# Patient Record
Sex: Male | Born: 1965 | Race: White | Hispanic: No | Marital: Single | Smoking: Never smoker
Health system: Southern US, Community
[De-identification: ages and names within clinical notes are randomized; demographics above are authoritative.]

## PROBLEM LIST (undated history)

## (undated) DIAGNOSIS — I10 Essential (primary) hypertension: Secondary | ICD-10-CM

## (undated) DIAGNOSIS — E119 Type 2 diabetes mellitus without complications: Secondary | ICD-10-CM

## (undated) DIAGNOSIS — F419 Anxiety disorder, unspecified: Secondary | ICD-10-CM

## (undated) HISTORY — PX: TONSILLECTOMY: SUR1361

---

## 1999-09-07 ENCOUNTER — Emergency Department (HOSPITAL_COMMUNITY): Admission: EM | Admit: 1999-09-07 | Discharge: 1999-09-07 | Payer: Self-pay | Admitting: Emergency Medicine

## 2003-12-09 ENCOUNTER — Emergency Department (HOSPITAL_COMMUNITY): Admission: EM | Admit: 2003-12-09 | Discharge: 2003-12-09 | Payer: Self-pay | Admitting: Emergency Medicine

## 2004-01-20 ENCOUNTER — Emergency Department (HOSPITAL_COMMUNITY): Admission: EM | Admit: 2004-01-20 | Discharge: 2004-01-20 | Payer: Self-pay | Admitting: Emergency Medicine

## 2004-03-19 ENCOUNTER — Ambulatory Visit (HOSPITAL_COMMUNITY): Admission: RE | Admit: 2004-03-19 | Discharge: 2004-03-19 | Payer: Self-pay | Admitting: Internal Medicine

## 2004-05-15 ENCOUNTER — Ambulatory Visit: Payer: Self-pay | Admitting: Internal Medicine

## 2004-10-04 ENCOUNTER — Ambulatory Visit: Payer: Self-pay | Admitting: Family Medicine

## 2004-10-08 ENCOUNTER — Ambulatory Visit: Payer: Self-pay | Admitting: *Deleted

## 2004-10-22 ENCOUNTER — Ambulatory Visit: Payer: Self-pay | Admitting: Internal Medicine

## 2005-02-16 ENCOUNTER — Inpatient Hospital Stay (HOSPITAL_COMMUNITY): Admission: EM | Admit: 2005-02-16 | Discharge: 2005-02-18 | Payer: Self-pay

## 2005-03-26 ENCOUNTER — Ambulatory Visit: Payer: Self-pay | Admitting: Internal Medicine

## 2005-04-16 ENCOUNTER — Ambulatory Visit: Payer: Self-pay | Admitting: Internal Medicine

## 2005-05-14 ENCOUNTER — Ambulatory Visit: Payer: Self-pay | Admitting: Internal Medicine

## 2005-12-23 ENCOUNTER — Ambulatory Visit: Payer: Self-pay | Admitting: Internal Medicine

## 2005-12-30 ENCOUNTER — Ambulatory Visit: Payer: Self-pay | Admitting: Family Medicine

## 2006-03-03 ENCOUNTER — Ambulatory Visit: Payer: Self-pay | Admitting: Internal Medicine

## 2006-03-31 ENCOUNTER — Ambulatory Visit: Payer: Self-pay | Admitting: Family Medicine

## 2006-05-08 ENCOUNTER — Emergency Department (HOSPITAL_COMMUNITY): Admission: EM | Admit: 2006-05-08 | Discharge: 2006-05-08 | Payer: Self-pay | Admitting: Emergency Medicine

## 2006-05-13 ENCOUNTER — Emergency Department (HOSPITAL_COMMUNITY): Admission: EM | Admit: 2006-05-13 | Discharge: 2006-05-13 | Payer: Self-pay | Admitting: Emergency Medicine

## 2006-07-09 ENCOUNTER — Emergency Department (HOSPITAL_COMMUNITY): Admission: EM | Admit: 2006-07-09 | Discharge: 2006-07-09 | Payer: Self-pay | Admitting: Emergency Medicine

## 2006-07-22 ENCOUNTER — Ambulatory Visit: Payer: Self-pay | Admitting: Internal Medicine

## 2006-10-09 ENCOUNTER — Ambulatory Visit: Payer: Self-pay | Admitting: Internal Medicine

## 2007-03-13 ENCOUNTER — Ambulatory Visit: Payer: Self-pay | Admitting: Internal Medicine

## 2007-05-04 ENCOUNTER — Ambulatory Visit: Payer: Self-pay | Admitting: Internal Medicine

## 2007-05-04 LAB — CONVERTED CEMR LAB
Albumin: 4.9 g/dL (ref 3.5–5.2)
Alkaline Phosphatase: 90 units/L (ref 39–117)
BUN: 16 mg/dL (ref 6–23)
CO2: 21 meq/L (ref 19–32)
Calcium: 10 mg/dL (ref 8.4–10.5)
Chloride: 96 meq/L (ref 96–112)
Glucose, Bld: 249 mg/dL — ABNORMAL HIGH (ref 70–99)
HDL: 48 mg/dL (ref >39)
Potassium: 4 meq/L (ref 3.5–5.3)
Total Bilirubin: 1.2 mg/dL (ref 0.3–1.2)
Total CHOL/HDL Ratio: 5.5

## 2007-05-13 ENCOUNTER — Encounter (INDEPENDENT_AMBULATORY_CARE_PROVIDER_SITE_OTHER): Payer: Self-pay | Admitting: *Deleted

## 2007-06-15 ENCOUNTER — Ambulatory Visit: Payer: Self-pay | Admitting: Internal Medicine

## 2007-10-28 ENCOUNTER — Ambulatory Visit: Payer: Self-pay | Admitting: Internal Medicine

## 2008-01-06 ENCOUNTER — Ambulatory Visit: Payer: Self-pay | Admitting: Internal Medicine

## 2008-03-22 ENCOUNTER — Emergency Department (HOSPITAL_COMMUNITY): Admission: EM | Admit: 2008-03-22 | Discharge: 2008-03-22 | Payer: Self-pay | Admitting: Emergency Medicine

## 2008-04-21 ENCOUNTER — Ambulatory Visit: Payer: Self-pay | Admitting: Internal Medicine

## 2008-09-08 ENCOUNTER — Ambulatory Visit: Payer: Self-pay | Admitting: Internal Medicine

## 2008-12-29 ENCOUNTER — Ambulatory Visit: Payer: Self-pay | Admitting: Internal Medicine

## 2009-01-19 ENCOUNTER — Ambulatory Visit: Payer: Self-pay | Admitting: Internal Medicine

## 2009-02-22 ENCOUNTER — Ambulatory Visit: Payer: Self-pay | Admitting: Family Medicine

## 2009-02-22 ENCOUNTER — Ambulatory Visit: Payer: Self-pay | Admitting: Internal Medicine

## 2009-06-08 ENCOUNTER — Ambulatory Visit: Payer: Self-pay | Admitting: Internal Medicine

## 2009-09-13 ENCOUNTER — Ambulatory Visit: Payer: Self-pay | Admitting: Internal Medicine

## 2009-09-13 LAB — CONVERTED CEMR LAB
Direct LDL: 120 mg/dL — ABNORMAL HIGH
HDL: 42 mg/dL (ref >39)
LDL Cholesterol: 118 mg/dL — ABNORMAL HIGH (ref 0–99)
Triglycerides: 301 mg/dL — ABNORMAL HIGH (ref <150)
VLDL: 60 mg/dL — ABNORMAL HIGH (ref 0–40)

## 2009-12-11 ENCOUNTER — Emergency Department (HOSPITAL_COMMUNITY): Admission: EM | Admit: 2009-12-11 | Discharge: 2009-12-11 | Payer: Self-pay | Admitting: Emergency Medicine

## 2010-01-04 ENCOUNTER — Ambulatory Visit: Payer: Self-pay | Admitting: Internal Medicine

## 2010-09-16 ENCOUNTER — Encounter: Payer: Self-pay | Admitting: Internal Medicine

## 2010-11-07 ENCOUNTER — Encounter (INDEPENDENT_AMBULATORY_CARE_PROVIDER_SITE_OTHER): Payer: Self-pay | Admitting: Family Medicine

## 2011-06-16 ENCOUNTER — Emergency Department (HOSPITAL_COMMUNITY): Payer: Self-pay

## 2011-06-16 ENCOUNTER — Emergency Department (HOSPITAL_COMMUNITY)
Admission: EM | Admit: 2011-06-16 | Discharge: 2011-06-16 | Disposition: A | Discharge status: Home / Self Care | Payer: Self-pay | Attending: Emergency Medicine | Admitting: Emergency Medicine

## 2011-06-16 DIAGNOSIS — R220 Localized swelling, mass and lump, head: Secondary | ICD-10-CM | POA: Insufficient documentation

## 2011-06-16 DIAGNOSIS — F1010 Alcohol abuse, uncomplicated: Secondary | ICD-10-CM | POA: Insufficient documentation

## 2011-06-16 DIAGNOSIS — S0003XA Contusion of scalp, initial encounter: Secondary | ICD-10-CM | POA: Insufficient documentation

## 2011-06-16 DIAGNOSIS — E119 Type 2 diabetes mellitus without complications: Secondary | ICD-10-CM | POA: Insufficient documentation

## 2011-06-16 DIAGNOSIS — R079 Chest pain, unspecified: Secondary | ICD-10-CM | POA: Insufficient documentation

## 2011-06-16 DIAGNOSIS — Z79899 Other long term (current) drug therapy: Secondary | ICD-10-CM | POA: Insufficient documentation

## 2011-06-16 DIAGNOSIS — W010XXA Fall on same level from slipping, tripping and stumbling without subsequent striking against object, initial encounter: Secondary | ICD-10-CM | POA: Insufficient documentation

## 2011-06-16 DIAGNOSIS — R4789 Other speech disturbances: Secondary | ICD-10-CM | POA: Insufficient documentation

## 2011-06-16 LAB — BASIC METABOLIC PANEL
BUN: 12 mg/dL (ref 6–23)
CO2: 22 mEq/L (ref 19–32)
Chloride: 93 mEq/L — ABNORMAL LOW (ref 96–112)
GFR calc Af Amer: >90 mL/min (ref >90)
Potassium: 3.7 mEq/L (ref 3.5–5.1)

## 2011-06-16 LAB — URINALYSIS, ROUTINE W REFLEX MICROSCOPIC
Hgb urine dipstick: NEGATIVE
Leukocytes, UA: NEGATIVE
Protein, ur: NEGATIVE mg/dL
Specific Gravity, Urine: 1.004 — ABNORMAL LOW (ref 1.005–1.030)
Urobilinogen, UA: 0.2 mg/dL (ref 0.0–1.0)

## 2011-06-16 LAB — DIFFERENTIAL
Basophils Absolute: 0.1 10*3/uL (ref 0.0–0.1)
Eosinophils Absolute: 0.2 10*3/uL (ref 0.0–0.7)
Lymphs Abs: 3.6 10*3/uL (ref 0.7–4.0)
Neutro Abs: 6.1 10*3/uL (ref 1.7–7.7)

## 2011-06-16 LAB — RAPID URINE DRUG SCREEN, HOSP PERFORMED
Amphetamines: NOT DETECTED
Barbiturates: NOT DETECTED
Opiates: NOT DETECTED

## 2011-06-16 LAB — CBC
HCT: 41.3 % (ref 39.0–52.0)
MCHC: 36.6 g/dL — ABNORMAL HIGH (ref 30.0–36.0)
MCV: 89 fL (ref 78.0–100.0)
RBC: 4.64 MIL/uL (ref 4.22–5.81)

## 2012-12-28 ENCOUNTER — Emergency Department (HOSPITAL_COMMUNITY)
Admission: EM | Admit: 2012-12-28 | Discharge: 2012-12-28 | Disposition: A | Discharge status: Home / Self Care | Payer: Self-pay | Source: Home / Self Care | Attending: Emergency Medicine | Admitting: Emergency Medicine

## 2012-12-28 ENCOUNTER — Encounter (HOSPITAL_COMMUNITY): Payer: Self-pay | Admitting: Emergency Medicine

## 2012-12-28 DIAGNOSIS — F419 Anxiety disorder, unspecified: Secondary | ICD-10-CM

## 2012-12-28 DIAGNOSIS — F411 Generalized anxiety disorder: Secondary | ICD-10-CM

## 2012-12-28 DIAGNOSIS — I10 Essential (primary) hypertension: Secondary | ICD-10-CM

## 2012-12-28 DIAGNOSIS — E119 Type 2 diabetes mellitus without complications: Secondary | ICD-10-CM

## 2012-12-28 HISTORY — DX: Type 2 diabetes mellitus without complications: E11.9

## 2012-12-28 HISTORY — DX: Essential (primary) hypertension: I10

## 2012-12-28 HISTORY — DX: Anxiety disorder, unspecified: F41.9

## 2012-12-28 LAB — POCT I-STAT, CHEM 8
Calcium, Ion: 1.14 mmol/L (ref 1.12–1.23)
Chloride: 99 mEq/L (ref 96–112)
Glucose, Bld: 574 mg/dL (ref 70–99)
Sodium: 133 mEq/L — ABNORMAL LOW (ref 135–145)
TCO2: 25 mmol/L (ref 0–100)

## 2012-12-28 LAB — HEMOGLOBIN A1C: Mean Plasma Glucose: 283 mg/dL — ABNORMAL HIGH (ref <117)

## 2012-12-28 MED ORDER — ALPRAZOLAM 1 MG PO TABS: 1.0000 mg | ORAL_TABLET | Freq: Three times a day (TID) | ORAL | Status: AC | PRN

## 2012-12-28 MED ORDER — CLONIDINE HCL 0.1 MG PO TABS
ORAL_TABLET | ORAL | Status: AC
  Filled 2012-12-28: qty 1

## 2012-12-28 MED ORDER — ASPIRIN 81 MG PO CHEW: 81.0000 mg | CHEWABLE_TABLET | ORAL | Status: DC

## 2012-12-28 MED ORDER — INSULIN ASPART 100 UNIT/ML ~~LOC~~ SOLN
SUBCUTANEOUS | Status: AC
  Filled 2012-12-28: qty 1

## 2012-12-28 MED ORDER — INSULIN GLARGINE 100 UNIT/ML ~~LOC~~ SOLN: 20.0000 [IU] | Freq: Every day | SUBCUTANEOUS | Status: DC

## 2012-12-28 MED ORDER — CLONIDINE HCL 0.1 MG PO TABS
0.1000 mg | ORAL_TABLET | Freq: Once | ORAL | Status: AC
  Administered 2012-12-28: 0.1 mg via ORAL

## 2012-12-28 MED ORDER — LISINOPRIL 10 MG PO TABS: 10.0000 mg | ORAL_TABLET | Freq: Every day | ORAL | Status: DC

## 2012-12-28 MED ORDER — INSULIN ASPART 100 UNIT/ML ~~LOC~~ SOLN
10.0000 [IU] | Freq: Once | SUBCUTANEOUS | Status: AC
  Administered 2012-12-28: 10 [IU] via SUBCUTANEOUS

## 2012-12-28 NOTE — ED Notes (Signed)
Pt is here to have meds refilled Taking Lantus 20 units, and xanax... Also was taking HP med but he doesn't remember the name Denies any medical prob  He is alert and oriented w/no signs of acute distress.

## 2012-12-28 NOTE — ED Provider Notes (Signed)
History     CSN: [id number]  Arrival date & time 12/28/12  1048   First MD Initiated Contact with Patient 12/28/12 1113      Chief Complaint  Patient presents with  . Medication Refill    (Consider location/radiation/quality/duration/timing/severity/associated sxs/prior treatment) HPI Comments: Patient presents to urgent care requesting blood pressure medicines. Has history of diabetes and hypertension and has been off his medicines as for about 6 months since [facility] closed, he has been monitoring his sugars at home and usually sees his glucose readings in the low 200s ( fasting). He has not monitored his blood pressure at home. This point he is uncertain about what blood pressure medicine he was taken previously. As well as he has not used his Lantus for approximately 6 months.  Currently patient describes it perhaps the only symptoms that he experiences recurrently his anxiety related . Denies any chest pains, palpitations shortness of breath headaches or paresthesias.    The history is provided by the patient.    Past Medical History  Diagnosis Date  . Anxiety   . Hypertension   . Diabetes mellitus without complication     Past Surgical History  Procedure Laterality Date  . Tonsillectomy      History reviewed. No pertinent family history.  History  Substance Use Topics  . Smoking status: Never Smoker   . Smokeless tobacco: Not on file  . Alcohol Use: No      Review of Systems  Constitutional: Positive for fatigue. Negative for fever, diaphoresis, activity change and unexpected weight change.  HENT: Negative for neck pain.   Respiratory: Negative for cough and shortness of breath.   Cardiovascular: Negative for chest pain, palpitations and leg swelling.  Gastrointestinal: Negative for abdominal pain.  Endocrine: Positive for polyuria. Negative for heat intolerance, polydipsia and polyphagia.  Musculoskeletal: Negative for myalgias, joint swelling and  arthralgias.  Neurological: Negative for dizziness, facial asymmetry, light-headedness and headaches.  Psychiatric/Behavioral: Negative for suicidal ideas.    Allergies  Review of patient's allergies indicates no known allergies.  Home Medications   Current Outpatient Rx  Name  Route  Sig  Dispense  Refill  . ALPRAZolam (XANAX) 1 MG tablet   Oral   Take 1 tablet (1 mg total) by mouth 3 (three) times daily as needed for sleep or anxiety.   30 tablet   0   . aspirin 81 MG chewable tablet   Oral   Chew 1 tablet (81 mg total) by mouth 3 (three) times a week.   30 tablet   0   . insulin glargine (LANTUS) 100 UNIT/ML injection   Subcutaneous   Inject 0.2 mLs (20 Units total) into the skin at bedtime.   10 mL   2   . lisinopril (PRINIVIL,ZESTRIL) 10 MG tablet   Oral   Take 1 tablet (10 mg total) by mouth daily.   30 tablet   1     BP 174/114  Pulse 90  Temp(Src) 99.4 F (37.4 C) (Oral)  Resp 18  SpO2 98%  Physical Exam  Nursing note and vitals reviewed. Constitutional: He is oriented to person, place, and time. Vital signs are normal. He appears well-developed and well-nourished.  Non-toxic appearance. He does not have a sickly appearance. He does not appear ill. No distress.  HENT:  Head: Normocephalic.  Mouth/Throat: No oropharyngeal exudate.  Eyes: Conjunctivae are normal. Right eye exhibits no discharge. Left eye exhibits no discharge. No scleral icterus.  Neck: Neck supple. No  JVD present.  Cardiovascular: Normal rate, regular rhythm and normal heart sounds.  Exam reveals no gallop and no friction rub.   No murmur heard. Pulmonary/Chest: Effort normal and breath sounds normal.  Neurological: He is alert and oriented to person, place, and time. He has normal strength. No cranial nerve deficit or sensory deficit.  Skin: No rash noted. No erythema.    ED Course  Procedures (including critical care time)  Labs Reviewed  POCT I-STAT, CHEM 8 - Abnormal; Notable  for the following:    Sodium 133 (*)    Glucose, Bld 574 (*)    All other components within normal limits  HEMOGLOBIN A1C   No results found.   1. Hypertension   2. Diabetes   3. Anxiety disorder      Pseudohyponatremia- hyperglycemia- normal kidney function. MDM  Problem #1 lack of medical care- patient will establish with new primary care Dr. at the adult [facility] Former patient of [facility]- aware of the adult care center services  Problem #2 diabetes uncontrolled hemoglobin A1c ordered to resume his previous dose of Lantus 20 units at night and to monitor his fasting sugars and keep score records for new PCP   Problem #3 hypertension uncontrolled- no medication adherence patient will be resume on his ACE inhibitor, was also asked to keep records of his blood pressure readings at home   Problem #4 generalized anxiety disorder patient was given a prescription of Xanax for 30 tablets.     Jimmie Molly, MD 12/28/12 [phone]

## 2016-03-03 ENCOUNTER — Emergency Department (HOSPITAL_COMMUNITY): Payer: Self-pay

## 2016-03-03 ENCOUNTER — Emergency Department (HOSPITAL_COMMUNITY)
Admission: EM | Admit: 2016-03-03 | Discharge: 2016-03-03 | Disposition: A | Discharge status: Home / Self Care | Payer: Self-pay | Attending: Emergency Medicine | Admitting: Emergency Medicine

## 2016-03-03 ENCOUNTER — Encounter (HOSPITAL_COMMUNITY): Payer: Self-pay | Admitting: *Deleted

## 2016-03-03 DIAGNOSIS — J939 Pneumothorax, unspecified: Secondary | ICD-10-CM

## 2016-03-03 DIAGNOSIS — Y9389 Activity, other specified: Secondary | ICD-10-CM | POA: Insufficient documentation

## 2016-03-03 DIAGNOSIS — S2232XA Fracture of one rib, left side, initial encounter for closed fracture: Secondary | ICD-10-CM

## 2016-03-03 DIAGNOSIS — E119 Type 2 diabetes mellitus without complications: Secondary | ICD-10-CM | POA: Insufficient documentation

## 2016-03-03 DIAGNOSIS — Z7982 Long term (current) use of aspirin: Secondary | ICD-10-CM | POA: Insufficient documentation

## 2016-03-03 DIAGNOSIS — S270XXA Traumatic pneumothorax, initial encounter: Secondary | ICD-10-CM | POA: Insufficient documentation

## 2016-03-03 DIAGNOSIS — I10 Essential (primary) hypertension: Secondary | ICD-10-CM | POA: Insufficient documentation

## 2016-03-03 DIAGNOSIS — Y929 Unspecified place or not applicable: Secondary | ICD-10-CM | POA: Insufficient documentation

## 2016-03-03 DIAGNOSIS — Z79899 Other long term (current) drug therapy: Secondary | ICD-10-CM | POA: Insufficient documentation

## 2016-03-03 DIAGNOSIS — Y999 Unspecified external cause status: Secondary | ICD-10-CM | POA: Insufficient documentation

## 2016-03-03 DIAGNOSIS — Z794 Long term (current) use of insulin: Secondary | ICD-10-CM | POA: Insufficient documentation

## 2016-03-03 MED ORDER — HYDROCODONE-ACETAMINOPHEN 5-325 MG PO TABS
1.0000 | ORAL_TABLET | Freq: Once | ORAL | Status: AC
  Administered 2016-03-03: 1 via ORAL
  Filled 2016-03-03: qty 1

## 2016-03-03 MED ORDER — HYDROCODONE-ACETAMINOPHEN 5-325 MG PO TABS: 1.0000 | ORAL_TABLET | Freq: Four times a day (QID) | ORAL | Status: AC | PRN

## 2016-03-03 MED ORDER — HYDROMORPHONE HCL 1 MG/ML IJ SOLN
1.0000 mg | Freq: Once | INTRAMUSCULAR | Status: AC
  Administered 2016-03-03: 1 mg via INTRAMUSCULAR
  Filled 2016-03-03: qty 1

## 2016-03-03 NOTE — Discharge Instructions (Signed)
As discussed, it is important that you have a second x-ray performed tomorrow morning to ensure that pneumothorax is improving.  If you develop new, or concerning changes in your condition, please be sure to return here immediately.  Otherwise, please take medication as directed, and use the provided incentive spirometer 4 times daily for the next 5 days.
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
## 2016-03-11 ENCOUNTER — Encounter (HOSPITAL_COMMUNITY): Payer: Self-pay | Admitting: Emergency Medicine

## 2016-03-11 ENCOUNTER — Emergency Department (HOSPITAL_COMMUNITY)
Admission: EM | Admit: 2016-03-11 | Discharge: 2016-03-11 | Disposition: A | Discharge status: Home / Self Care | Payer: No Typology Code available for payment source | Attending: Emergency Medicine | Admitting: Emergency Medicine

## 2016-03-11 ENCOUNTER — Emergency Department (HOSPITAL_COMMUNITY): Payer: No Typology Code available for payment source

## 2016-03-11 DIAGNOSIS — S2232XA Fracture of one rib, left side, initial encounter for closed fracture: Secondary | ICD-10-CM

## 2016-03-11 DIAGNOSIS — Y939 Activity, unspecified: Secondary | ICD-10-CM | POA: Insufficient documentation

## 2016-03-11 DIAGNOSIS — Y999 Unspecified external cause status: Secondary | ICD-10-CM | POA: Insufficient documentation

## 2016-03-11 DIAGNOSIS — Z7984 Long term (current) use of oral hypoglycemic drugs: Secondary | ICD-10-CM | POA: Insufficient documentation

## 2016-03-11 DIAGNOSIS — I10 Essential (primary) hypertension: Secondary | ICD-10-CM | POA: Insufficient documentation

## 2016-03-11 DIAGNOSIS — W19XXXA Unspecified fall, initial encounter: Secondary | ICD-10-CM | POA: Insufficient documentation

## 2016-03-11 DIAGNOSIS — E119 Type 2 diabetes mellitus without complications: Secondary | ICD-10-CM | POA: Insufficient documentation

## 2016-03-11 DIAGNOSIS — R0602 Shortness of breath: Secondary | ICD-10-CM | POA: Insufficient documentation

## 2016-03-11 DIAGNOSIS — Z79899 Other long term (current) drug therapy: Secondary | ICD-10-CM | POA: Insufficient documentation

## 2016-03-11 DIAGNOSIS — Y929 Unspecified place or not applicable: Secondary | ICD-10-CM | POA: Insufficient documentation

## 2016-03-11 MED ORDER — HYDROMORPHONE HCL 1 MG/ML IJ SOLN
1.0000 mg | Freq: Once | INTRAMUSCULAR | Status: AC
  Administered 2016-03-11: 1 mg via INTRAMUSCULAR
  Filled 2016-03-11: qty 1

## 2016-03-11 MED ORDER — OXYCODONE-ACETAMINOPHEN 7.5-325 MG PO TABS: 1.0000 | ORAL_TABLET | ORAL | Status: DC | PRN

## 2016-03-11 NOTE — Discharge Instructions (Signed)

## 2016-03-11 NOTE — ED Provider Notes (Addendum)
CSN: [id number]     Arrival date & time 03/11/16  1250 History   First MD Initiated Contact with Patient 03/11/16 1318     Chief Complaint  Patient presents with  . Shortness of Breath  . Back Pain     Patient is a 50 y.o. male presenting with shortness of breath and back pain.  Shortness of Breath Associated symptoms: chest pain   Associated symptoms: no abdominal pain, no fever and no sore throat   Back Pain Associated symptoms: chest pain   Associated symptoms: no abdominal pain and no fever   Patient presents with worsening left-sided back pain. Was seen in the ER 8 days ago after fall and diagnosed with a left fifth rib fracture. Had a small pneumothorax the time. . Follow-up the next day with the surgeons, but was unable to get transportation there. States he was doing well over last few days but today had a cough and had severe pain in the back. Pain is worse with movement. No worse difficulty breathing. States the pain medicine helps but he is running out and thinks he may need something stronger.  Past Medical History  Diagnosis Date  . Anxiety   . Hypertension   . Diabetes mellitus without complication [facility])    Past Surgical History  Procedure Laterality Date  . Tonsillectomy     No family history on file. Social History  Substance Use Topics  . Smoking status: Never Smoker   . Smokeless tobacco: None  . Alcohol Use: No    Review of Systems  Constitutional: Negative for appetite change and fever.  HENT: Negative for sore throat and voice change.   Respiratory: Positive for shortness of breath.   Cardiovascular: Positive for chest pain.  Gastrointestinal: Negative for abdominal pain.  Genitourinary: Negative for difficulty urinating.  Musculoskeletal: Positive for back pain.  Psychiatric/Behavioral: Negative for behavioral problems.      Allergies  Review of patient's allergies indicates no known allergies.  Home Medications   Prior to Admission  medications   Medication Sig Start Date End Date Taking? Authorizing Provider  ALPRAZolam Prudy Feeler) 1 MG tablet Take 1 tablet (1 mg total) by mouth 3 (three) times daily as needed for sleep or anxiety. 12/28/12  Yes Jimmie Molly, MD  glyBURIDE (DIABETA) 5 MG tablet Take 5 mg by mouth 2 (two) times daily. 02/10/16  Yes Historical Provider, MD  metFORMIN (GLUCOPHAGE) 500 MG tablet TAKE 1 TABLET BY MOUTH 2 TIMES PER DAY WITH MORNING AND EVENING MEALS 02/10/16  Yes Historical Provider, MD  HYDROcodone-acetaminophen (NORCO/VICODIN) 5-325 MG tablet Take 1 tablet by mouth every 6 (six) hours as needed for moderate pain. Patient not taking: Reported on 03/11/2016 03/03/16   Gerhard Munch, MD  oxyCODONE-acetaminophen (PERCOCET) 7.5-325 MG tablet Take 1 tablet by mouth every 4 (four) hours as needed for severe pain. 03/11/16   Benjiman Core, MD   BP 138/92 mmHg  Pulse 89  Temp(Src) 98.1 F (36.7 C) (Oral)  Resp 16  SpO2 100% Physical Exam  Constitutional: He appears well-developed.  HENT:  Head: Atraumatic.  Neck: Neck supple.  Cardiovascular: Normal rate.   Pulmonary/Chest: Effort normal. He exhibits tenderness.  Severe tenderness to left mid lateral chest wall. No subcutaneous emphysema.  Abdominal: Soft. There is no tenderness.  Skin: Skin is warm.    ED Course  Procedures (including critical care time) Labs Review Labs Reviewed - No data to display  Imaging Review Dg Ribs Unilateral W/chest Left  03/11/2016  CLINICAL  DATA:  Recent left fifth rib fracture. New pain after coughing last ninth EXAM: LEFT RIBS AND CHEST - 3+ VIEW COMPARISON:  03/03/2016 FINDINGS: Five views of the left ribs submitted. Again noted displaced fracture of the left fifth rib with increased displacement from prior exam. There is new displaced fracture of the left fourth rib. Tiny left apical pneumothorax is stable. Old right rib fractures are stable. IMPRESSION: Again noted displaced fracture of the left fifth rib with  increased displacement from prior exam. There is new displaced fracture of the left fourth rib. Tiny left apical pneumothorax is stable. Old right rib fractures are stable. Electronically Signed   By: Natasha MeadLiviu  Pop M.D.   On: 03/11/2016 14:17   I have personally reviewed and evaluated these images and lab results as part of my medical decision-making.   EKG Interpretation None      MDM   Final diagnoses:  Rib fractures, left, closed, initial encounter    Patient with rib fracture. Now has new rib fracture after old 1 a week ago. Pneumothorax stable. Discussed with trauma clinic will see in follow-up but is unable to give a specific time. Will discharge home.    Benjiman CoreNathan Hajra Port, MD 03/12/16 1803  Benjiman CoreNathan Pasqualino Witherspoon, MD 04/06/16 [phone]

## 2016-03-11 NOTE — ED Notes (Signed)
Patient reports seen recently for rib fracture. Cough last night, "felt rib pop" and had worsening pain as well as difficulty breathing.

## 2016-03-11 NOTE — Progress Notes (Signed)
EDCM asked to speak to patient regarding transportation to outpatient surgical clinic.  [facility] [employer].  Patient reports he usues public transportation for 75 cents, usues it all the time, familiar with [employer]. [facility] will be calling him for an appointment per EDP.  Patient without transportation issues at this time.  Patient wanting to be discharged.  Discussed with EDP and EDRN.  No further EDCM needs at this time.

## 2016-03-19 ENCOUNTER — Encounter (HOSPITAL_COMMUNITY): Payer: Self-pay

## 2016-03-19 ENCOUNTER — Emergency Department (HOSPITAL_COMMUNITY)
Admission: EM | Admit: 2016-03-19 | Discharge: 2016-03-19 | Disposition: A | Discharge status: Home / Self Care | Payer: Self-pay | Attending: Emergency Medicine | Admitting: Emergency Medicine

## 2016-03-19 DIAGNOSIS — Y92410 Unspecified street and highway as the place of occurrence of the external cause: Secondary | ICD-10-CM | POA: Insufficient documentation

## 2016-03-19 DIAGNOSIS — Z7984 Long term (current) use of oral hypoglycemic drugs: Secondary | ICD-10-CM | POA: Insufficient documentation

## 2016-03-19 DIAGNOSIS — Y939 Activity, unspecified: Secondary | ICD-10-CM | POA: Insufficient documentation

## 2016-03-19 DIAGNOSIS — S2232XA Fracture of one rib, left side, initial encounter for closed fracture: Secondary | ICD-10-CM

## 2016-03-19 DIAGNOSIS — E119 Type 2 diabetes mellitus without complications: Secondary | ICD-10-CM | POA: Insufficient documentation

## 2016-03-19 DIAGNOSIS — S2242XD Multiple fractures of ribs, left side, subsequent encounter for fracture with routine healing: Secondary | ICD-10-CM | POA: Insufficient documentation

## 2016-03-19 DIAGNOSIS — Z79899 Other long term (current) drug therapy: Secondary | ICD-10-CM | POA: Insufficient documentation

## 2016-03-19 DIAGNOSIS — Y999 Unspecified external cause status: Secondary | ICD-10-CM | POA: Insufficient documentation

## 2016-03-19 DIAGNOSIS — I10 Essential (primary) hypertension: Secondary | ICD-10-CM | POA: Insufficient documentation

## 2016-03-19 MED ORDER — OXYCODONE-ACETAMINOPHEN 5-325 MG PO TABS
1.0000 | ORAL_TABLET | Freq: Once | ORAL | Status: AC
  Administered 2016-03-19: 1 via ORAL
  Filled 2016-03-19: qty 1

## 2016-03-19 MED ORDER — OXYCODONE-ACETAMINOPHEN 5-325 MG PO TABS: 1.0000 | ORAL_TABLET | Freq: Four times a day (QID) | ORAL | 0 refills | Status: AC | PRN

## 2016-03-19 NOTE — Discharge Instructions (Signed)
Take Tylenol for mild pain or the pain medicine prescribed for bad pain. Don't take the pain medicine prescribed together with Tylenol as the combination can be dangerous to your liver. Use your incentive spirometer 4 times per hour while awake. Call [facility] tomorrow to schedule the next available appointment. Tell office staff that we've spoken with Dr. August Saucer about your case
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
## 2016-06-14 ENCOUNTER — Emergency Department (HOSPITAL_COMMUNITY)
Admission: EM | Admit: 2016-06-14 | Discharge: 2016-06-14 | Disposition: A | Discharge status: Home / Self Care | Payer: Self-pay | Attending: Emergency Medicine | Admitting: Emergency Medicine

## 2016-06-14 ENCOUNTER — Emergency Department (HOSPITAL_COMMUNITY): Payer: Self-pay

## 2016-06-14 ENCOUNTER — Encounter (HOSPITAL_COMMUNITY): Payer: Self-pay | Admitting: Emergency Medicine

## 2016-06-14 DIAGNOSIS — R062 Wheezing: Secondary | ICD-10-CM

## 2016-06-14 DIAGNOSIS — I10 Essential (primary) hypertension: Secondary | ICD-10-CM | POA: Insufficient documentation

## 2016-06-14 DIAGNOSIS — E119 Type 2 diabetes mellitus without complications: Secondary | ICD-10-CM | POA: Insufficient documentation

## 2016-06-14 DIAGNOSIS — Z79899 Other long term (current) drug therapy: Secondary | ICD-10-CM | POA: Insufficient documentation

## 2016-06-14 DIAGNOSIS — J40 Bronchitis, not specified as acute or chronic: Secondary | ICD-10-CM | POA: Insufficient documentation

## 2016-06-14 DIAGNOSIS — Z7984 Long term (current) use of oral hypoglycemic drugs: Secondary | ICD-10-CM | POA: Insufficient documentation

## 2016-06-14 MED ORDER — PREDNISONE 20 MG PO TABS
60.0000 mg | ORAL_TABLET | Freq: Once | ORAL | Status: AC
  Administered 2016-06-14: 60 mg via ORAL
  Filled 2016-06-14: qty 3

## 2016-06-14 MED ORDER — ALBUTEROL SULFATE (2.5 MG/3ML) 0.083% IN NEBU
5.0000 mg | INHALATION_SOLUTION | Freq: Once | RESPIRATORY_TRACT | Status: AC
  Administered 2016-06-14: 5 mg via RESPIRATORY_TRACT
  Filled 2016-06-14: qty 6

## 2016-06-14 MED ORDER — ALBUTEROL SULFATE HFA 108 (90 BASE) MCG/ACT IN AERS
2.0000 | INHALATION_SPRAY | RESPIRATORY_TRACT | Status: DC | PRN
  Administered 2016-06-14: 2 via RESPIRATORY_TRACT
  Filled 2016-06-14: qty 6.7

## 2016-06-14 MED ORDER — PREDNISONE 20 MG PO TABS: ORAL_TABLET | ORAL | 0 refills | Status: AC

## 2016-06-14 NOTE — ED Notes (Signed)
Discharge instructions, follow up care, and rx x1 reviewed with patient. Patient verbalized understanding. 

## 2016-06-14 NOTE — ED Provider Notes (Signed)
[facility]-EMERGENCY DEPT Provider Note   CSN: [id number] Arrival date & time: 06/14/16  1415     History   Chief Complaint Chief Complaint  Patient presents with  . Shortness of Breath    HPI Jason Mccullough is a 50 y.o. male.  Patient is a 50 year old male who presents with shortness of breath and wheezing. He states over the last 2 days he's felt a little weak and felt like he was maybe coming down with a cold. He was with his father who had bronchitis last week. He's had a dry cough and wheezing with associated shortness of breath. He denies any chest pain. No known fevers. No leg pain or swelling. No pleuritic chest pain. No recent immobilization. No recent surgeries. No prior history of DVTs. He was a prior smoker but quit 5 years ago. He did get a nebulized treatment prior to my evaluation and feels much better following this. He's had a couple episodes of wheezing and past which have been associated with bronchitis. He does not currently use inhalers at home.    Shortness of Breath  Associated symptoms include rhinorrhea, cough and wheezing. Pertinent negatives include no fever, no headaches, no chest pain, no vomiting, no abdominal pain, no rash and no leg swelling.    Past Medical History:  Diagnosis Date  . Anxiety   . Diabetes mellitus without complication (HCC)   . Hypertension     There are no active problems to display for this patient.   Past Surgical History:  Procedure Laterality Date  . TONSILLECTOMY         Home Medications    Prior to Admission medications   Medication Sig Start Date End Date Taking? Authorizing Provider  ALPRAZolam Prudy Feeler) 1 MG tablet Take 1 tablet (1 mg total) by mouth 3 (three) times daily as needed for sleep or anxiety. Patient taking differently: Take 1 mg by mouth 2 (two) times daily as needed for anxiety or sleep.  12/28/12  Yes Jimmie Molly, MD  glyBURIDE (DIABETA) 5 MG tablet Take 5 mg by mouth 2 (two) times daily. 02/10/16  Yes  Historical Provider, MD  metFORMIN (GLUCOPHAGE) 500 MG tablet TAKE 1 TABLET BY MOUTH 2 TIMES PER DAY WITH MORNING AND EVENING MEALS 02/10/16  Yes Historical Provider, MD  HYDROcodone-acetaminophen (NORCO/VICODIN) 5-325 MG tablet Take 1 tablet by mouth every 6 (six) hours as needed for moderate pain. Patient not taking: Reported on 06/14/2016 03/03/16   Gerhard Munch, MD  oxyCODONE-acetaminophen (PERCOCET) 5-325 MG tablet Take 1 tablet by mouth every 6 (six) hours as needed for severe pain. Patient not taking: Reported on 06/14/2016 03/19/16   Doug Sou, MD  predniSONE (DELTASONE) 20 MG tablet 2 tabs po daily x 4 days 06/14/16   Rolan Bucco, MD    Family History No family history on file.  Social History Social History  Substance Use Topics  . Smoking status: Never Smoker  . Smokeless tobacco: Never Used  . Alcohol use No     Allergies   Review of patient's allergies indicates no known allergies.   Review of Systems Review of Systems  Constitutional: Positive for fatigue. Negative for chills, diaphoresis and fever.  HENT: Positive for rhinorrhea. Negative for congestion and sneezing.   Eyes: Negative.   Respiratory: Positive for cough, shortness of breath and wheezing. Negative for chest tightness.   Cardiovascular: Negative for chest pain and leg swelling.  Gastrointestinal: Negative for abdominal pain, blood in stool, diarrhea, nausea and vomiting.  Genitourinary:  Negative for difficulty urinating, flank pain, frequency and hematuria.  Musculoskeletal: Negative for arthralgias and back pain.  Skin: Negative for rash.  Neurological: Negative for dizziness, speech difficulty, weakness, numbness and headaches.     Physical Exam Updated Vital Signs BP 107/83   Pulse 90   Temp 97.9 F (36.6 C) (Oral)   Resp 18   SpO2 97%   Physical Exam  Constitutional: He is oriented to person, place, and time. He appears well-developed and well-nourished.  HENT:  Head:  Normocephalic and atraumatic.  Eyes: Pupils are equal, round, and reactive to light.  Neck: Normal range of motion. Neck supple.  Cardiovascular: Normal rate, regular rhythm and normal heart sounds.   Pulmonary/Chest: Effort normal. No respiratory distress. He has wheezes. He has no rales. He exhibits no tenderness.  Mild expiratory wheezes in the bases, no increased work of breathing  Abdominal: Soft. Bowel sounds are normal. There is no tenderness. There is no rebound and no guarding.  Musculoskeletal: Normal range of motion. He exhibits no edema.  No edema or calf tenderness  Lymphadenopathy:    He has no cervical adenopathy.  Neurological: He is alert and oriented to person, place, and time.  Skin: Skin is warm and dry. No rash noted.  Psychiatric: He has a normal mood and affect.     ED Treatments / Results  Labs (all labs ordered are listed, but only abnormal results are displayed) Labs Reviewed - No data to display  EKG  EKG Interpretation None       Radiology Dg Chest 2 View  Result Date: 06/14/2016 CLINICAL DATA:  Shortness of breath.  Former smoker. EXAM: CHEST  2 VIEW COMPARISON:  03/11/2016 FINDINGS: There are old left upper rib fractures. Old right fifth rib fracture. Both lungs are clear. Heart and mediastinum are within normal limits. No pleural effusions. IMPRESSION: No active cardiopulmonary disease. Old bilateral rib fractures. Electronically Signed   By: Richarda OverlieAdam  Henn M.D.   On: 06/14/2016 15:41    Procedures Procedures (including critical care time)  Medications Ordered in ED Medications  albuterol (PROVENTIL HFA;VENTOLIN HFA) 108 (90 Base) MCG/ACT inhaler 2 puff (2 puffs Inhalation Given 06/14/16 1539)  albuterol (PROVENTIL) (2.5 MG/3ML) 0.083% nebulizer solution 5 mg (5 mg Nebulization Given 06/14/16 1450)  predniSONE (DELTASONE) tablet 60 mg (60 mg Oral Given 06/14/16 1538)     Initial Impression / Assessment and Plan / ED Course  I have reviewed  the triage vital signs and the nursing notes.  Pertinent labs & imaging results that were available during my care of the patient were reviewed by me and considered in my medical decision making (see chart for details).  Clinical Course    Patient is feeling much better after treatment. He currently only has scarce wheezing on exam with no increased work of breathing. He's maintaining normal oxygen saturations. He has no other suggestions of VTE.  There is no evidence of pneumonia. He was discharged home in good condition. He was started on a five-day course of prednisone and dispensed an albuterol inhaler. He was instructed to follow-up with his PCP. He states he has no appointment within the next month. Return precautions were given.  Final Clinical Impressions(s) / ED Diagnoses   Final diagnoses:  Bronchitis  Wheezing    New Prescriptions New Prescriptions   PREDNISONE (DELTASONE) 20 MG TABLET    2 tabs po daily x 4 days     Rolan BuccoMelanie Kathyann Spaugh, MD 06/14/16 1611

## 2016-06-14 NOTE — ED Triage Notes (Signed)
Pt complaint of productive cough and SOB onset yesterday. Expiratory wheeze heard.

## 2017-06-27 IMAGING — CR DG CHEST 2V
2 series · 2 of 2 positions shown · non-contrast
Comparison: 03/11/2016

CLINICAL DATA: Shortness of breath.  Former smoker.

EXAM:
CHEST  2 VIEW

[w chest pa]
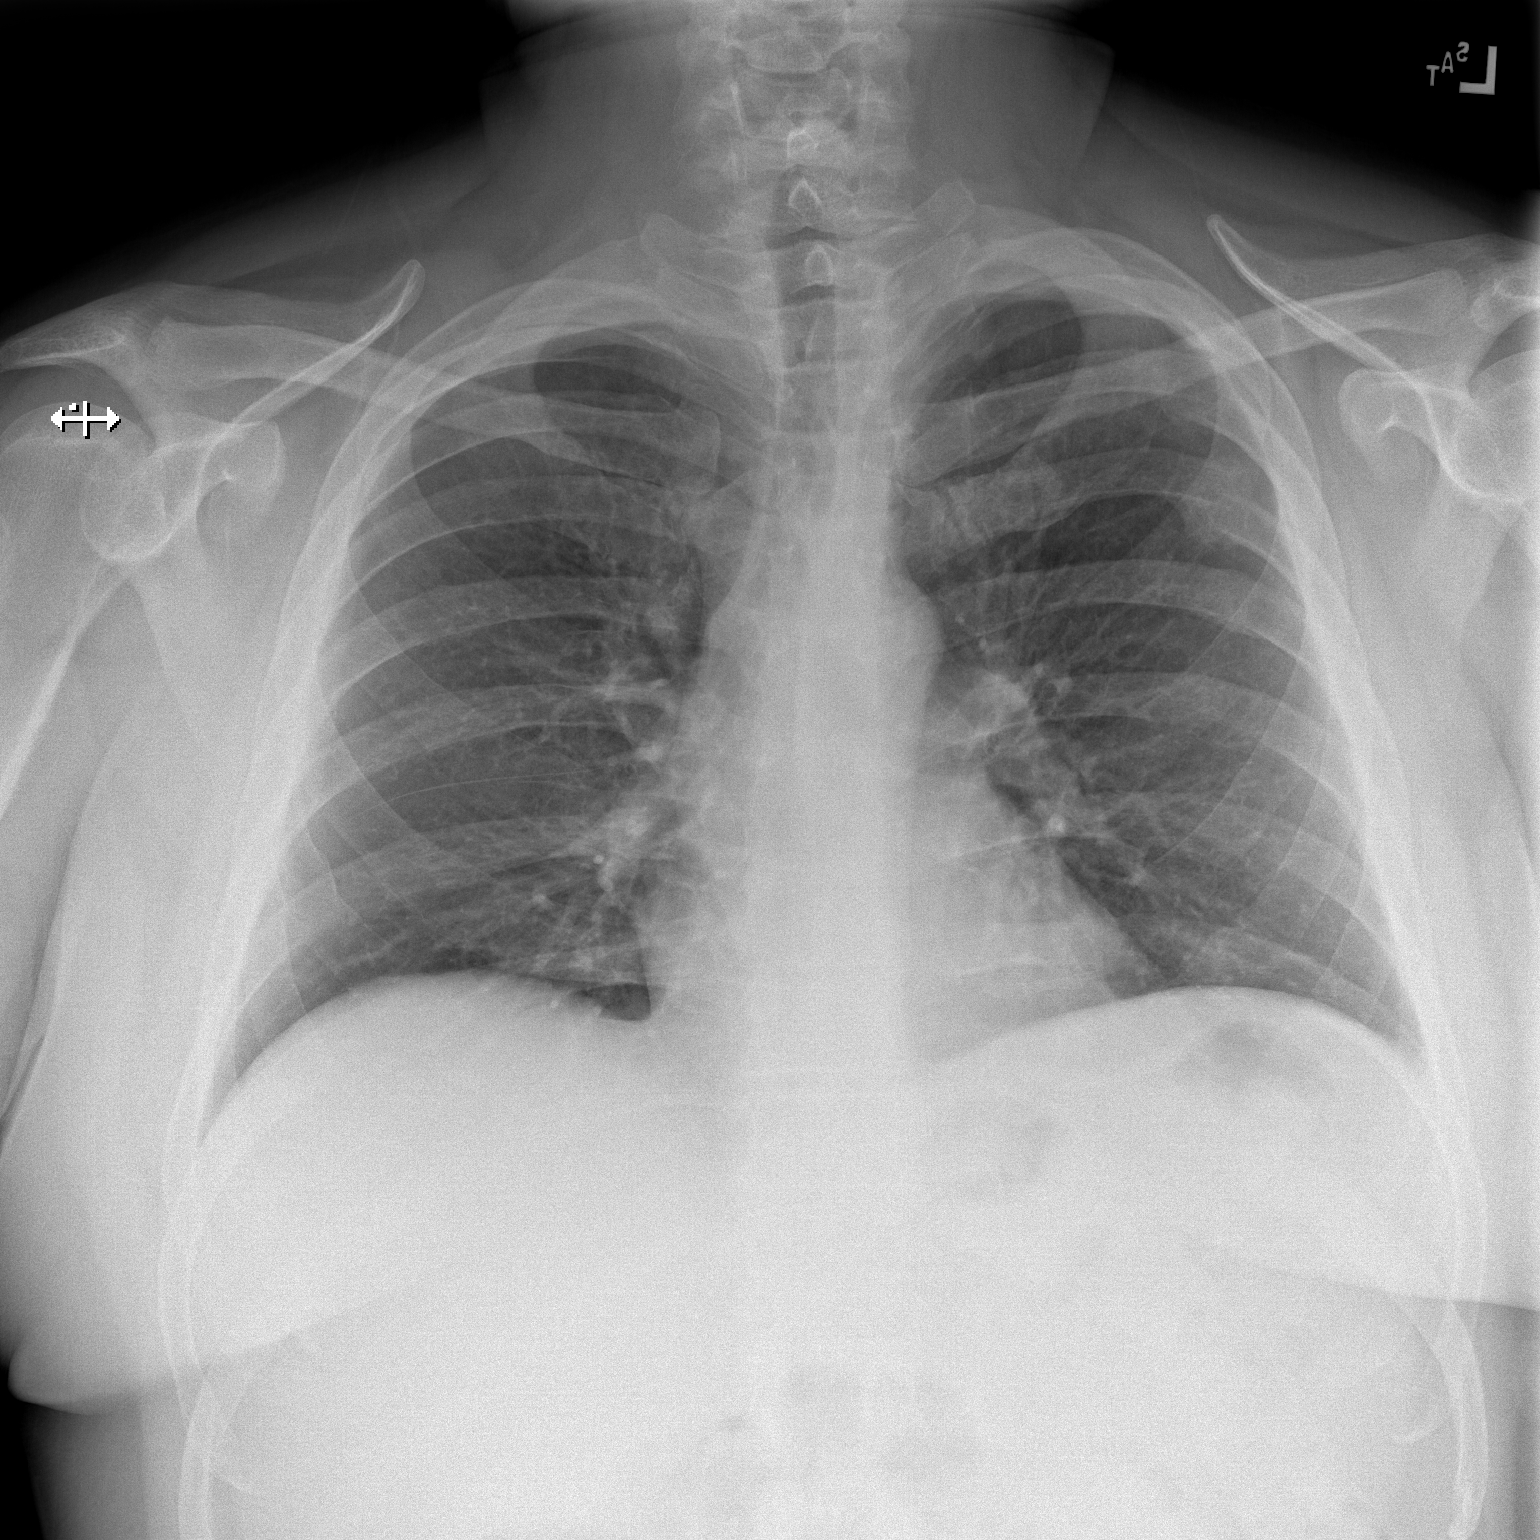

[w chest lat]
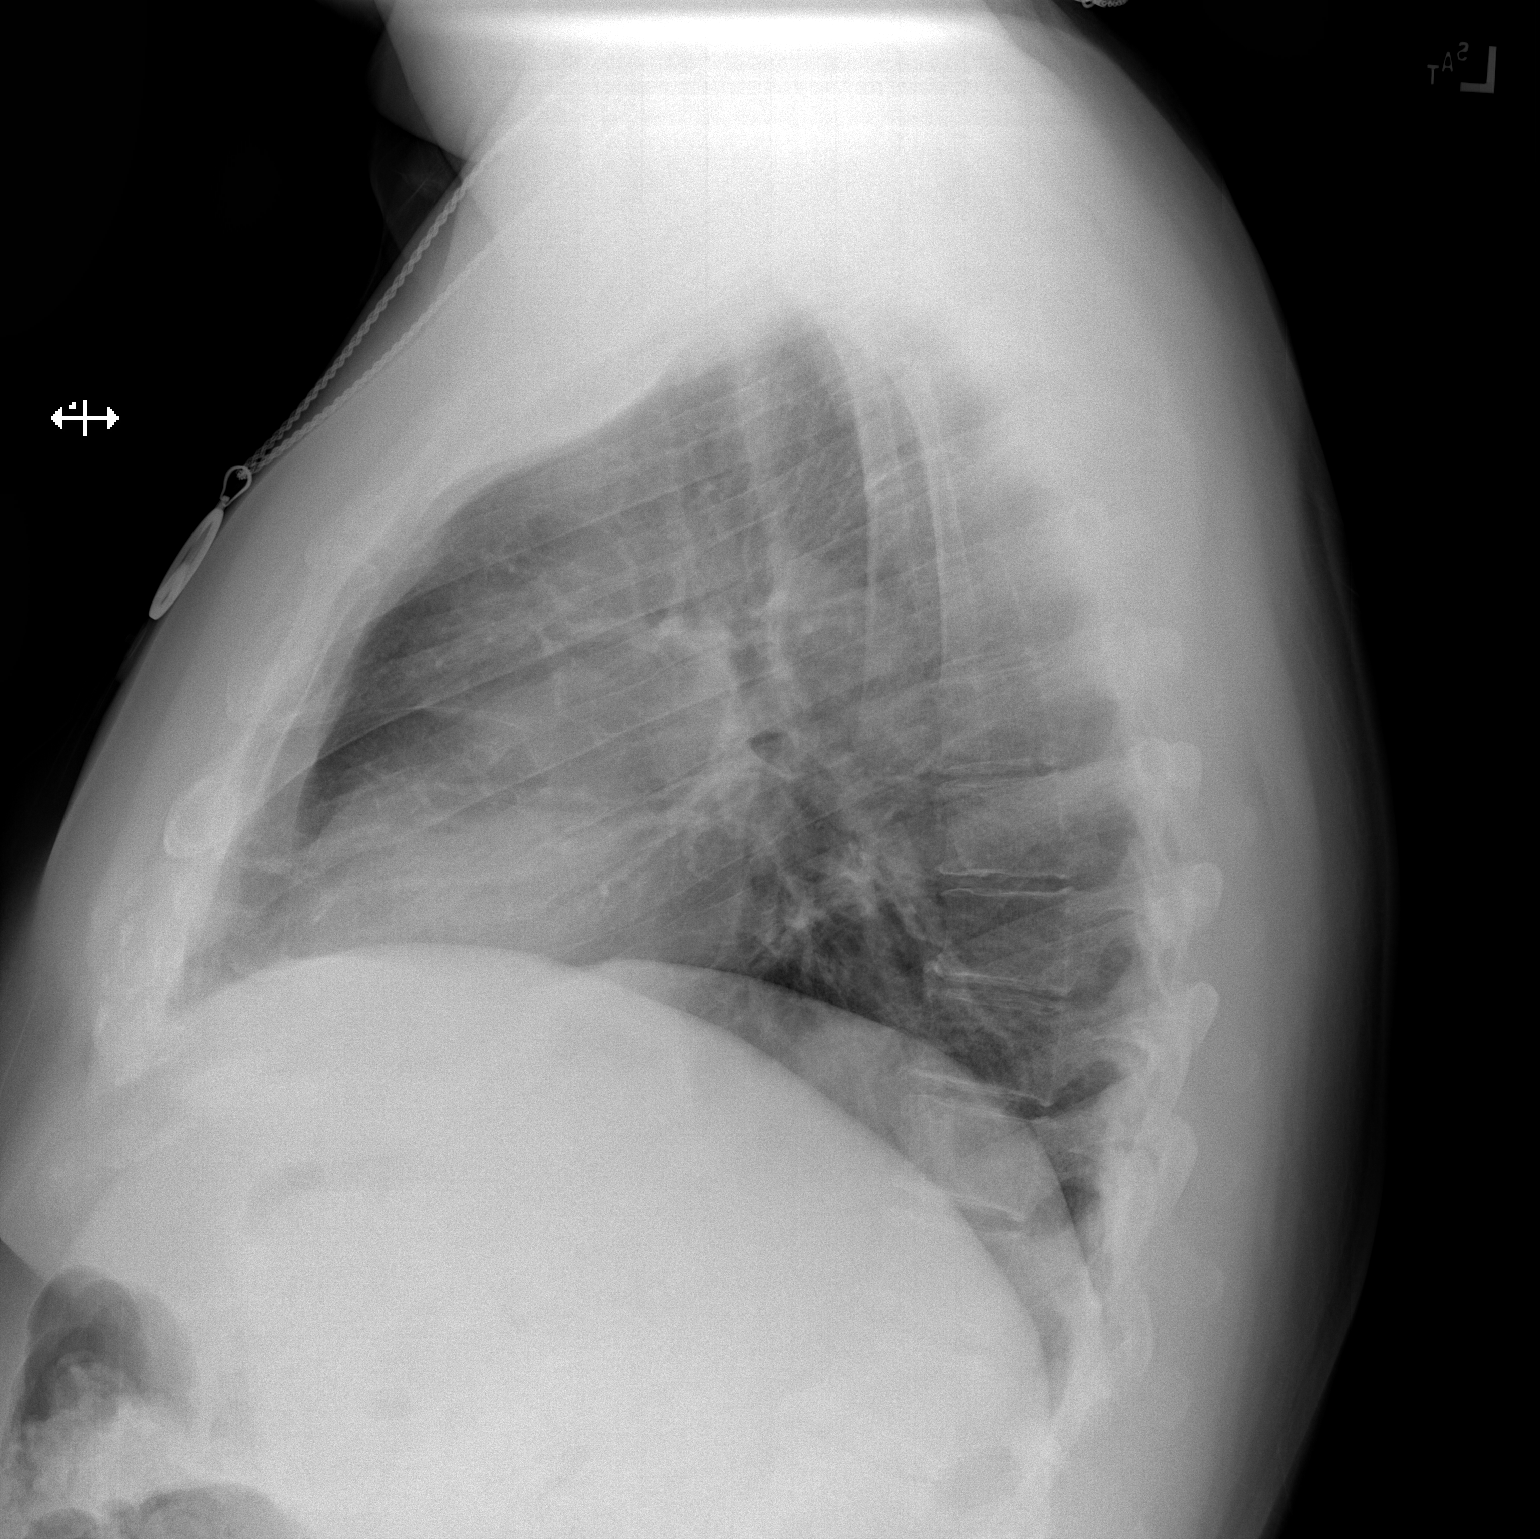

[2 of 2 positions shown; findings below may reference images not displayed]

FINDINGS: There are old left upper rib fractures. Old right fifth rib
fracture. Both lungs are clear. Heart and mediastinum are within
normal limits. No pleural effusions.
IMPRESSION: No active cardiopulmonary disease.

Old bilateral rib fractures.
